# Patient Record
Sex: Male | Born: 1990 | Race: Black or African American | Hispanic: No | Marital: Single | Smoking: Current some day smoker
Health system: Southern US, Community
[De-identification: ages and names within clinical notes are randomized; demographics above are authoritative.]

## PROBLEM LIST (undated history)

## (undated) DIAGNOSIS — T7840XA Allergy, unspecified, initial encounter: Secondary | ICD-10-CM

## (undated) DIAGNOSIS — J45909 Unspecified asthma, uncomplicated: Secondary | ICD-10-CM

## (undated) HISTORY — DX: Unspecified asthma, uncomplicated: J45.909

## (undated) HISTORY — DX: Allergy, unspecified, initial encounter: T78.40XA

---

## 2016-01-19 ENCOUNTER — Ambulatory Visit (INDEPENDENT_AMBULATORY_CARE_PROVIDER_SITE_OTHER): Payer: BLUE CROSS/BLUE SHIELD | Admitting: Physician Assistant

## 2016-01-19 ENCOUNTER — Ambulatory Visit (INDEPENDENT_AMBULATORY_CARE_PROVIDER_SITE_OTHER): Payer: BLUE CROSS/BLUE SHIELD

## 2016-01-19 VITALS — BP 126/70 | HR 74 | Temp 98.4°F | Resp 16 | Ht 68.5 in | Wt 169.4 lb

## 2016-01-19 DIAGNOSIS — R6884 Jaw pain: Secondary | ICD-10-CM

## 2016-01-19 DIAGNOSIS — S0181XA Laceration without foreign body of other part of head, initial encounter: Secondary | ICD-10-CM | POA: Diagnosis not present

## 2016-01-19 NOTE — Patient Instructions (Addendum)
Ibuprofen 600 mg three times a day  Continue the muscle rub you are using Check with your insurance about getting seen by an oral surgeon You will get a phone call to make appt.    IF you received an x-ray today, you will receive an invoice from [facility]. Please contact [facility] at [phone] with questions or concerns regarding your invoice.   IF you received labwork today, you will receive an invoice from [facility]. Please contact [facility] at [phone] with questions or concerns regarding your invoice.   Our billing staff will not be able to assist you with questions regarding bills from these companies.  You will be contacted with the lab results as soon as they are available. The fastest way to get your results is to activate your My Chart account. Instructions are located on the last page of this paperwork. If you have not heard from us regarding the results in 2 weeks, please contact this office.

## 2016-01-19 NOTE — Progress Notes (Signed)
[facility] [address], [place] [address] [phone]  Date:  01/19/2016   Name:  Justin Hess   DOB:  10-06-1990   MRN:  [id number]  PCP:  No primary care provider on file.    Chief Complaint: Larey SeatFell on Friday and abrasion   History of Present Illness:  This is a 25 y.o. male who is presenting after falling and hitting his face 3 days ago. He was walking in a wet parking lot and slipped and fell forward and landed on his chin. Sustained a laceration to chin. He noticed the next morning that he was unable to open his mouth. He applied an OTC muscle rub to this jaw and that seemed to help. Overall feeling better but states he feels like sometimes is "off". States "something doesn't feel right". He is able to open his mouth now but not like normal. He was able to eat a burger earlier today. He has been applying neosporin to his chin.   Review of Systems:  Review of Systems See HPI  There are no active problems to display for this patient.   Prior to Admission medications   Not on File    No Known Allergies  History reviewed. No pertinent past surgical history.  Social History  Substance Use Topics  . Smoking status: Current Some Day Smoker  . Smokeless tobacco: None  . Alcohol Use: No     Comment: 2    History reviewed. No pertinent family history.  Medication list has been reviewed and updated.  Physical Examination:  Physical Exam  Constitutional: He is oriented to person, place, and time. He appears well-developed and well-nourished. No distress.  HENT:  Head: Normocephalic and atraumatic.  Right Ear: Hearing normal.  Left Ear: Hearing normal.  Nose: Nose normal.  Mouth/Throat: Uvula is midline, oropharynx is clear and moist and mucous membranes are normal.  TMJ palpated without abnormalities.  Left side of jaw seems slightly lower than right With jaw clenching, masseter muscle on right bulges more than left. No significant TTP.   Unable to open mouth fully, probably 50%. 1 cm healing laceration on chin.   Eyes: Conjunctivae and lids are normal. Right eye exhibits no discharge. Left eye exhibits no discharge. No scleral icterus.  Pulmonary/Chest: Effort normal. No respiratory distress.  Musculoskeletal: Normal range of motion.  Neurological: He is alert and oriented to person, place, and time.  Skin: Skin is warm, dry and intact. No lesion and no rash noted.  Psychiatric: He has a normal mood and affect. His speech is normal and behavior is normal. Thought content normal.   BP 126/70 mmHg  Pulse 74  Temp(Src) 98.4 F (36.9 C) (Oral)  Resp 16  Ht 5' 8.5" (1.74 m)  Wt 169 lb 6.4 oz (76.839 kg)  BMI 25.38 kg/m2  SpO2 98%  Dg Mandible 4 Views  01/19/2016  CLINICAL DATA:  Fall 3 days prior with trauma injury. Unable to fully open mouth. EXAM: MANDIBLE - 4+ VIEW COMPARISON:  None. FINDINGS: No fracture or focal osseous lesion is seen in the mandible. The temporomandibular joints are not well visualized on these views. IMPRESSION: No fracture evident in the mandible. Temporomandibular joints are not well visualized on these views, and a temporomandibular joint dislocation cannot be excluded on the basis of these views. If the patient's symptoms persist, consider further evaluation with maxillofacial CT. Electronically Signed   By: Delbert PhenixJason A Poff M.D.   On: 01/19/2016 17:00  Assessment and Plan:  1. Jaw pain No acute abnormalities on mandible film. Radiologist recommended CT maxillofacial to further view TMJs if symptoms persisted. Pt does not want to go through with CT. I have referred to oral surgeon instead for further eval/imaging. He declined nsaids or muscle relaxer rx.  Return if symptoms worsen. - DG Mandible 4 Views; Future - Ambulatory referral to Oral Maxillofacial Surgery  2. Laceration of chin, initial encounter Continue home wound care.   [place] Brackett, MHS [facility]  01/19/2016

## 2017-03-11 IMAGING — CR DG MANDIBLE 4+V
3 series · 3 of 3 positions shown · non-contrast
Comparison: None.

CLINICAL DATA: Fall 3 days prior with trauma injury. Unable to
fully open mouth.

EXAM:
MANDIBLE - 4+ VIEW

[PA]
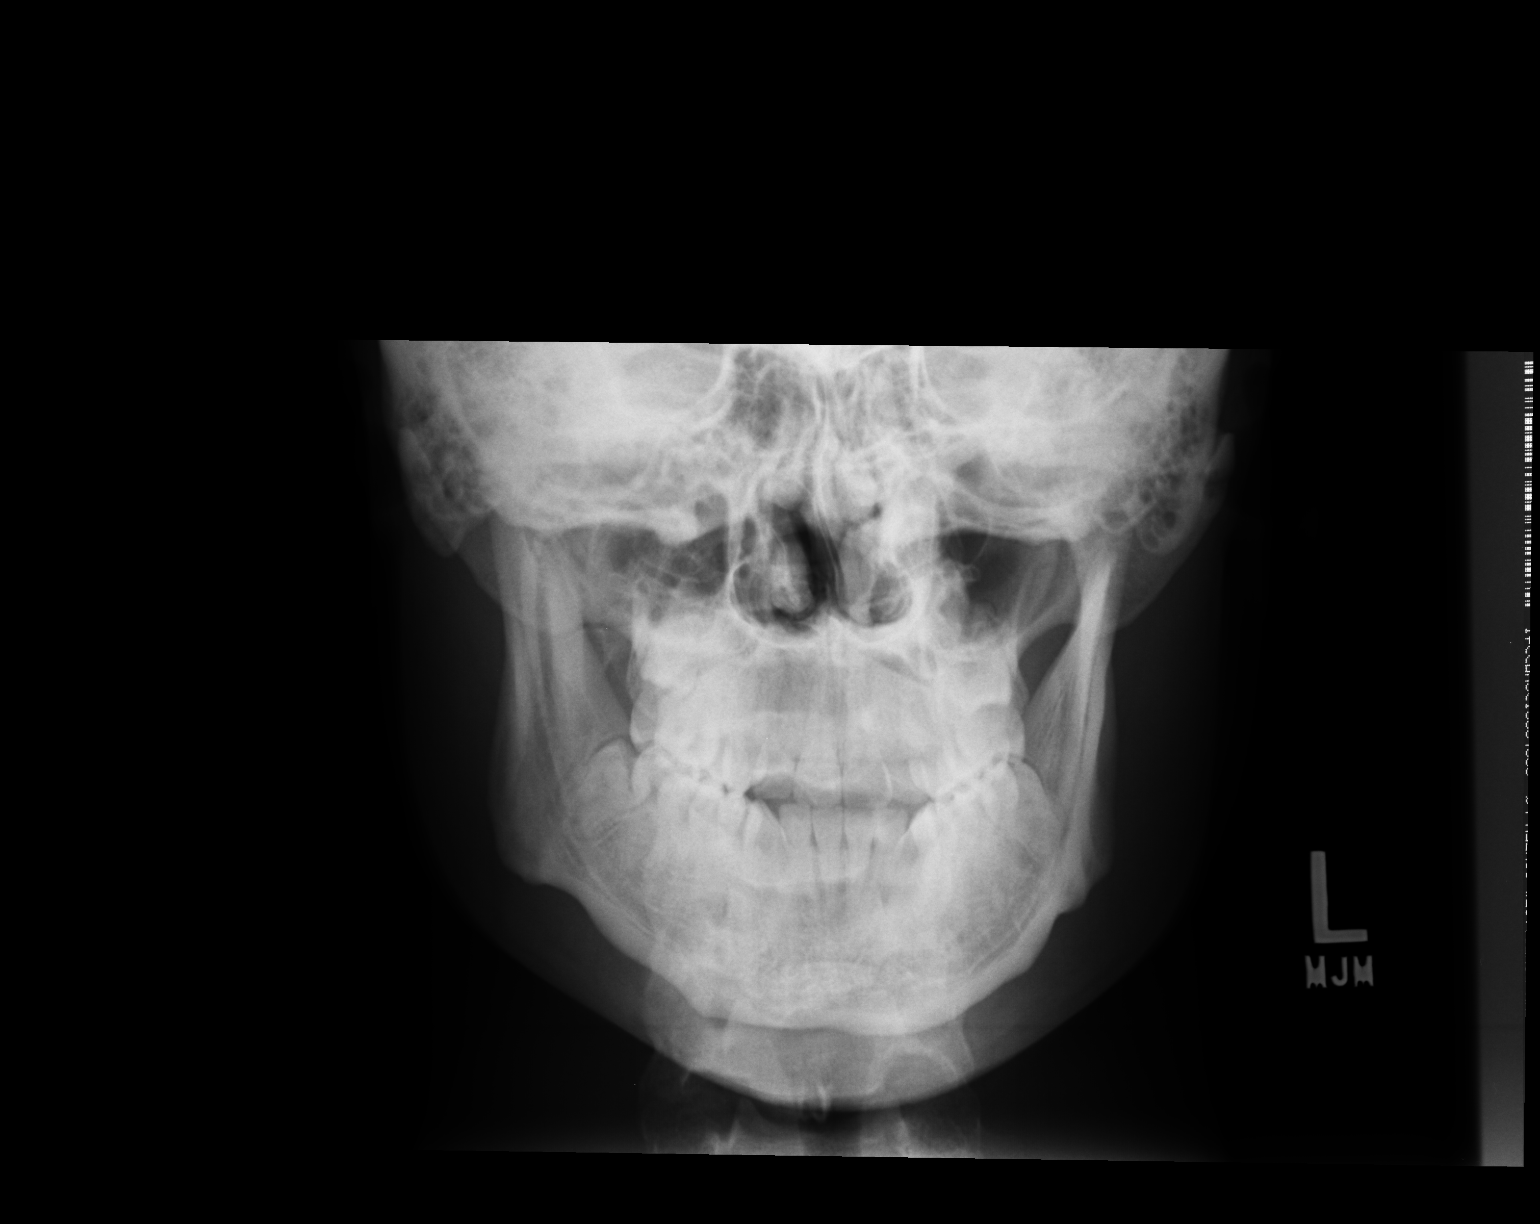

[axiolateral obl (1 of 2)]
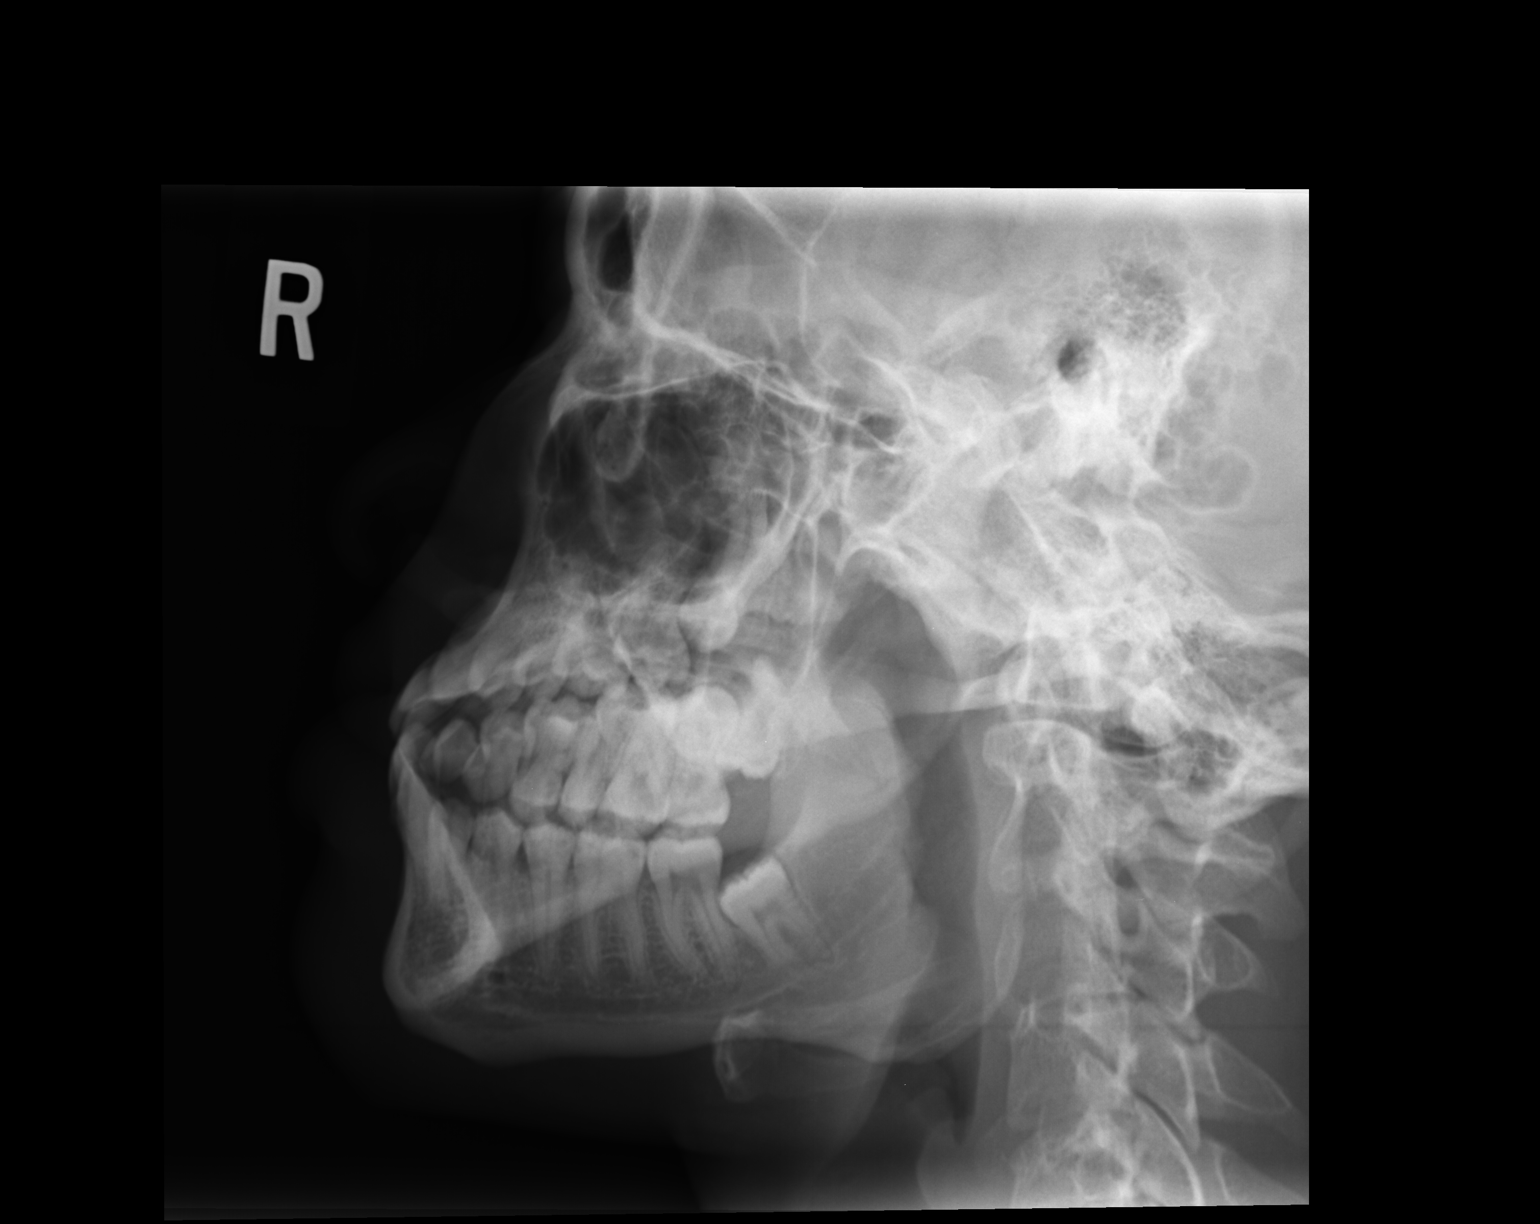

[axiolateral obl (2 of 2)]
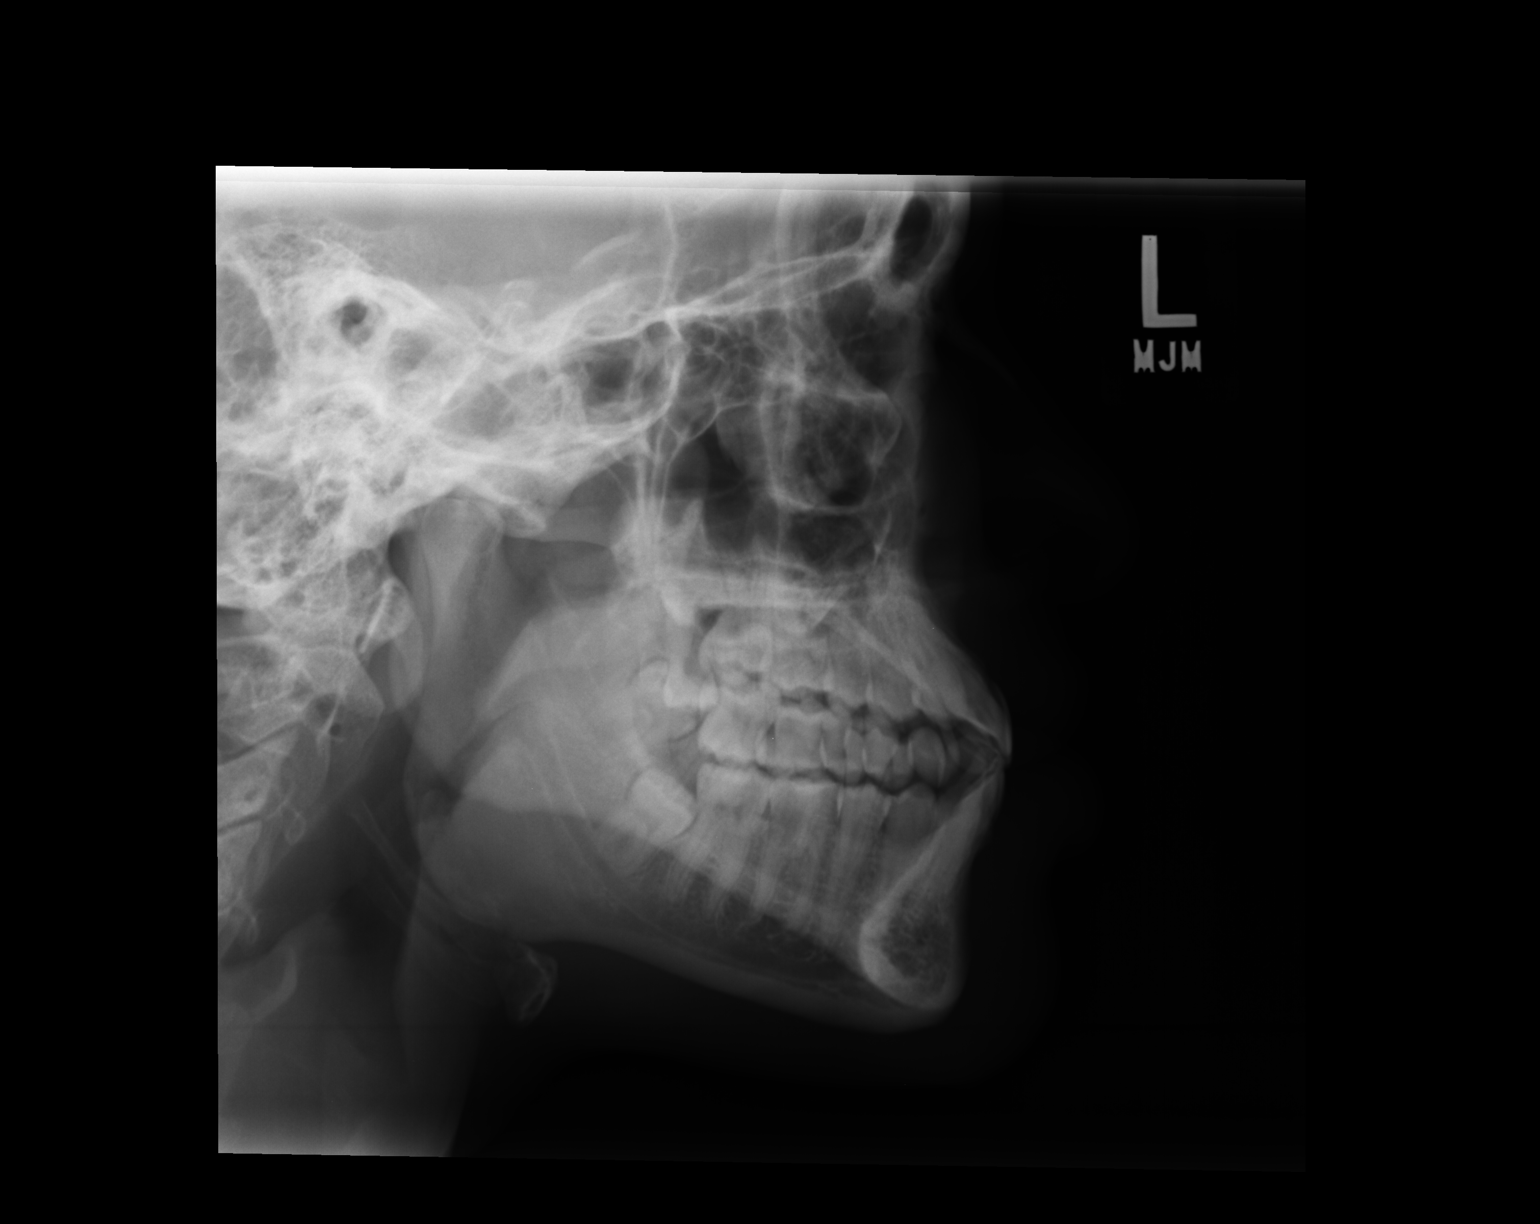

[3 of 3 positions shown; findings below may reference images not displayed]

FINDINGS: No fracture or focal osseous lesion is seen in the mandible. The
temporomandibular joints are not well visualized on these views.
IMPRESSION: No fracture evident in the mandible. Temporomandibular joints are
not well visualized on these views, and a temporomandibular joint
dislocation cannot be excluded on the basis of these views. If the
patient's symptoms persist, consider further evaluation with
maxillofacial CT.
